# Patient Record
Sex: Female | Born: 1935 | Race: White
Health system: Southern US, Community
[De-identification: ages and names within clinical notes are randomized; demographics above are authoritative.]

---

## 2010-08-15 ENCOUNTER — Other Ambulatory Visit: Payer: Self-pay | Admitting: Gastroenterology

## 2010-08-15 ENCOUNTER — Ambulatory Visit
Admission: RE | Admit: 2010-08-15 | Discharge: 2010-08-15 | Disposition: A | Discharge status: Home / Self Care | Payer: MEDICARE | Source: Ambulatory Visit | Attending: Gastroenterology | Admitting: Gastroenterology

## 2012-07-18 IMAGING — RF DG ESOPHAGUS
14 of 17 series · 19 of 24 positions shown · non-contrast
Comparison: None.

CLINICAL DATA: Dysphagia

ESOPHOGRAM/BARIUM SWALLOW
TECHNIQUE: Single contrast examination was performed using thin
barium.
Fluoroscopy time:  1.6 minutes.

[Series 1: run · 4 of 11 slices shown (1 of 14)]
[im 1/11]
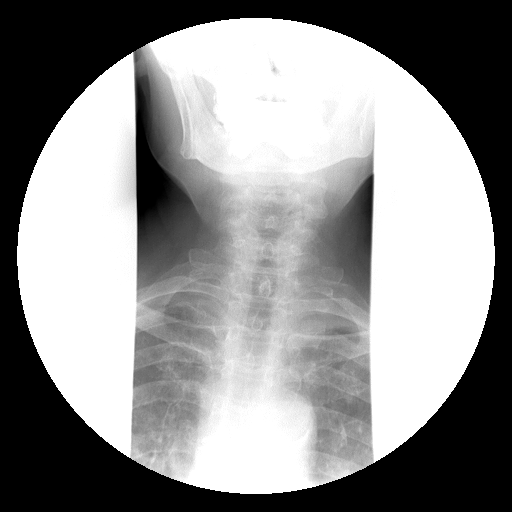
[im 3/11]
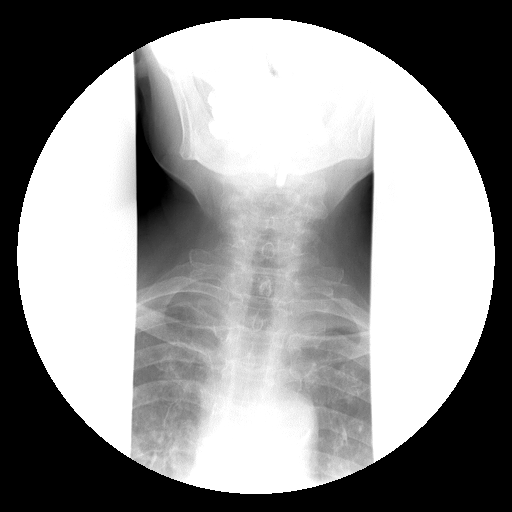
[im 8/11]
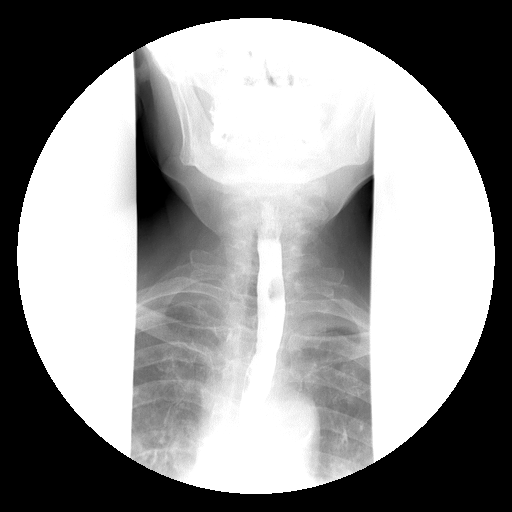
[im 11/11]
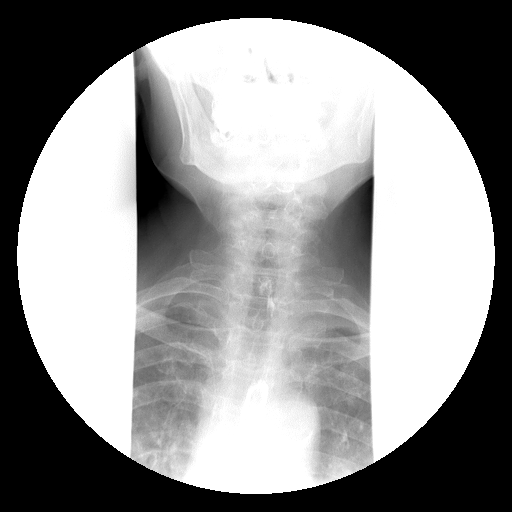

[Series 2: run · 3 of 8 slices shown (2 of 14)]
[im 1/8]
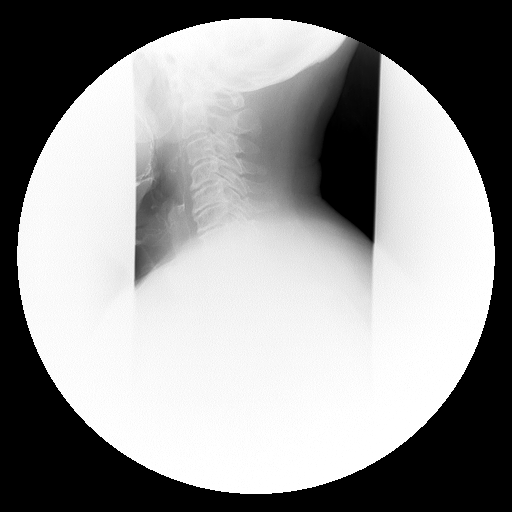
[im 3/8]
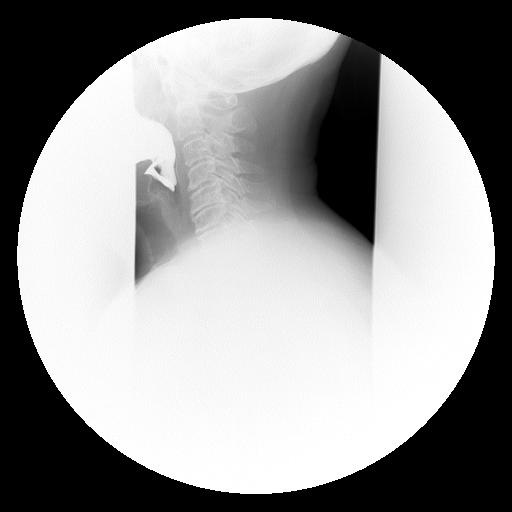
[im 8/8]
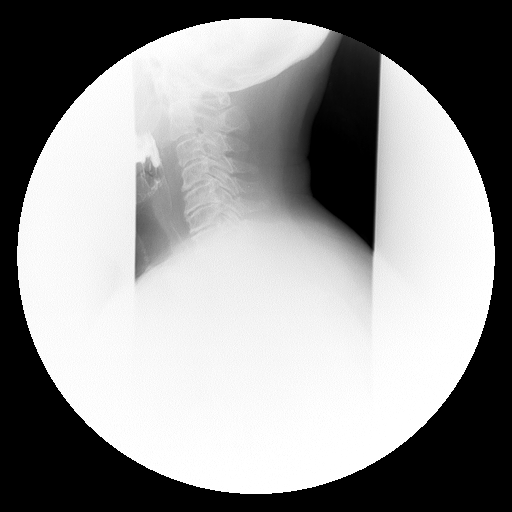

[Series 3: run · 1 of 1 slices shown (3 of 14)]
[im 1/1]
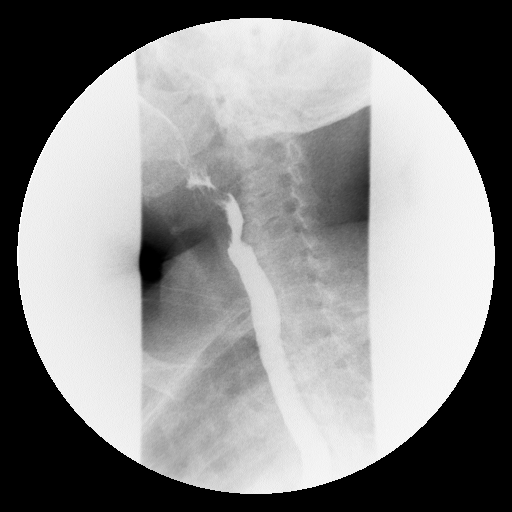

[Series 4: run · 1 of 1 slices shown (4 of 14)]
[im 1/1]
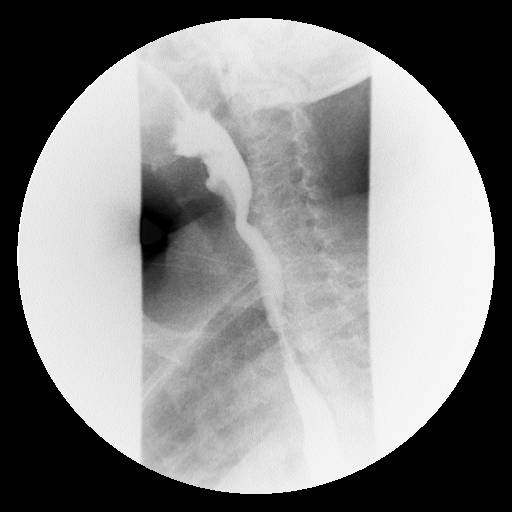

[Series 7: run · 1 of 1 slices shown (5 of 14)]
[im 1/1]
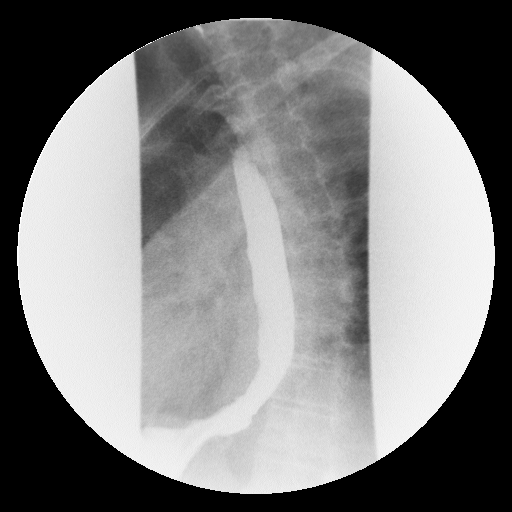

[Series 8: run · 1 of 1 slices shown (6 of 14)]
[im 1/1]
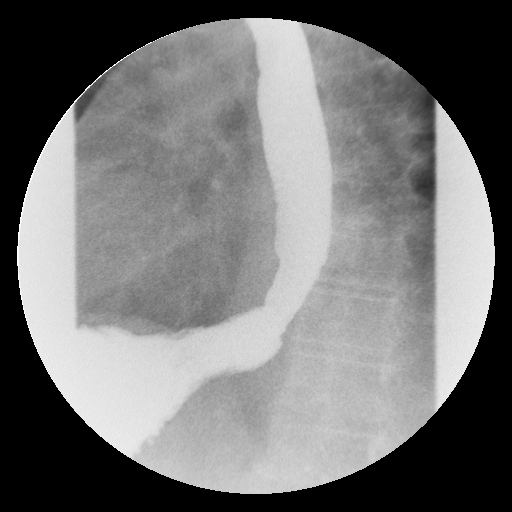

[Series 9: run · 1 of 1 slices shown (7 of 14)]
[im 1/1]
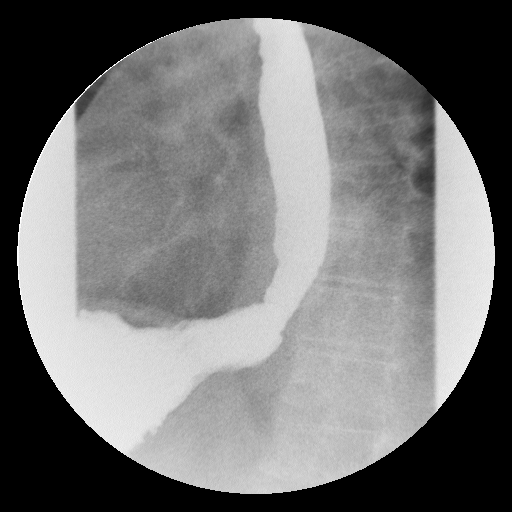

[Series 10: run · 1 of 1 slices shown (8 of 14)]
[im 1/1]
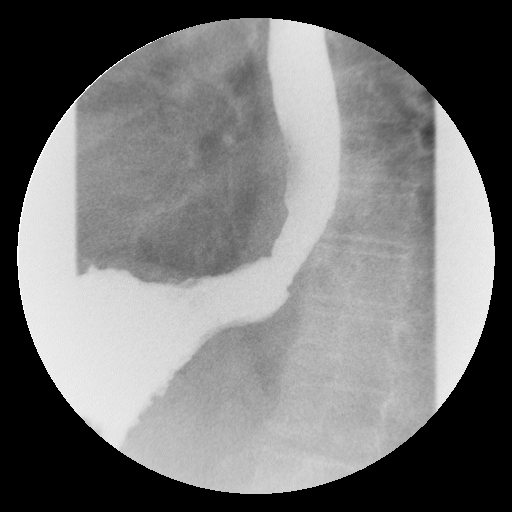

[Series 12: run · 1 of 1 slices shown (9 of 14)]
[im 1/1]
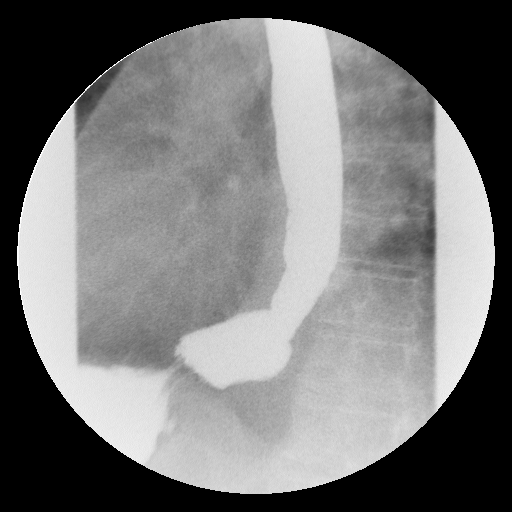

[Series 13: run · 1 of 1 slices shown (10 of 14)]
[im 1/1]
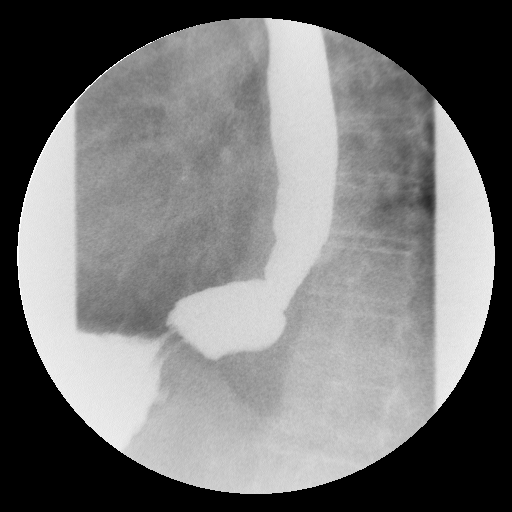

[Series 14: run · 1 of 1 slices shown (11 of 14)]
[im 1/1]
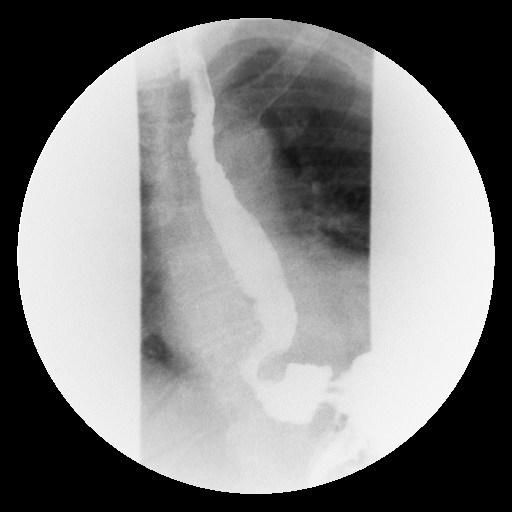

[Series 15: run · 1 of 1 slices shown (12 of 14)]
[im 1/1]
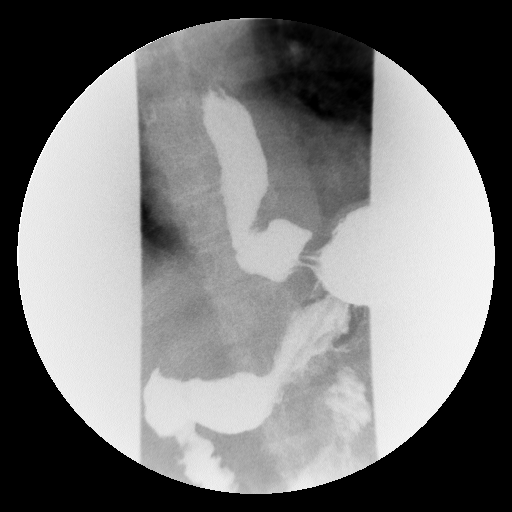

[Series 17: run · 1 of 1 slices shown (13 of 14)]
[im 1/1]
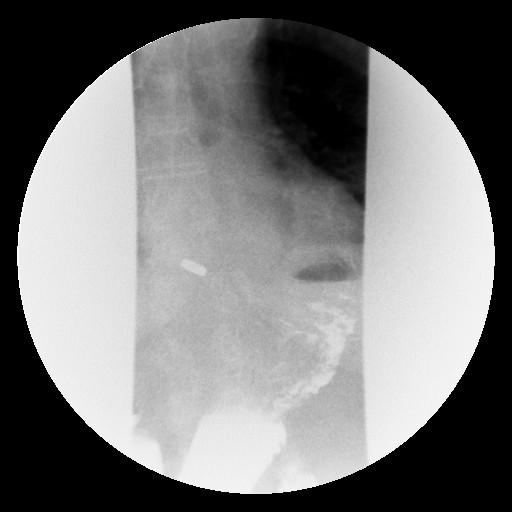

[Series 18: run · 1 of 1 slices shown (14 of 14)]
[im 1/1]
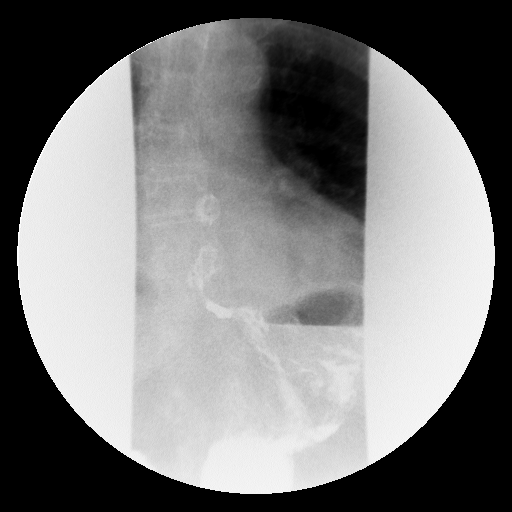

[19 of 24 positions shown; findings below may reference images not displayed]

FINDINGS: A single contrast study shows the swallowing mechanism
to be normal.  There are moderate tertiary contractions in the mid
and distal esophagus.  A small hiatal hernia is present.  There is
moderate gastroesophageal reflux demonstrated.  A barium pill was
given at the end of the study which did lodge in the distal
esophagus suggesting a distal esophageal short segment stricture.
IMPRESSION: 1.  Small hiatal hernia with moderate reflux.
2.  Barium pill lodges in the distal esophagus suggesting a short
segment distal esophageal stricture.
3.  Moderate tertiary contractions.

## 2016-01-28 ENCOUNTER — Telehealth: Payer: Self-pay | Admitting: Cardiology

## 2016-01-28 NOTE — Telephone Encounter (Signed)
[facility] 03/25/16 records given to Nentia h (medical records) CN

## 2016-01-29 ENCOUNTER — Ambulatory Visit: Payer: Self-pay | Admitting: Cardiology

## 2016-03-19 NOTE — Progress Notes (Deleted)
     HPI: 82 yo female for evaluation of atrial fibrillation.   No current outpatient prescriptions on file.   No current facility-administered medications for this visit.     Allergies not on file  No past medical history on file.  No past surgical history on file.  Social History   Social History  . Marital status: Unknown    Spouse name: N/A  . Number of children: N/A  . Years of education: N/A   Occupational History  . Not on file.   Social History Main Topics  . Smoking status: Not on file  . Smokeless tobacco: Not on file  . Alcohol use Not on file  . Drug use: Unknown  . Sexual activity: Not on file   Other Topics Concern  . Not on file   Social History Narrative  . No narrative on file    No family history on file.  ROS: no fevers or chills, productive cough, hemoptysis, dysphasia, odynophagia, melena, hematochezia, dysuria, hematuria, rash, seizure activity, orthopnea, PND, pedal edema, claudication. Remaining systems are negative.  Physical Exam:   There were no vitals taken for this visit.  General:  Well developed/well nourished in NAD Skin warm/dry Patient not depressed No peripheral clubbing Back-normal HEENT-normal/normal eyelids Neck supple/normal carotid upstroke bilaterally; no bruits; no JVD; no thyromegaly chest - CTA/ normal expansion CV - RRR/normal S1 and S2; no murmurs, rubs or gallops;  PMI nondisplaced Abdomen -NT/ND, no HSM, no mass, + bowel sounds, no bruit 2+ femoral pulses, no bruits Ext-no edema, chords, 2+ DP Neuro-grossly nonfocal  ECG

## 2016-03-25 ENCOUNTER — Ambulatory Visit: Payer: Self-pay | Admitting: Cardiology

## 2016-11-10 DEATH — deceased
